# Patient Record
Sex: Male | Born: 1970 | Race: White | Hispanic: No | Marital: Married | Smoking: Current every day smoker
Health system: Southern US, Community
[De-identification: ages and names within clinical notes are randomized; demographics above are authoritative.]

---

## 2014-11-23 ENCOUNTER — Encounter
Admission: EM | Disposition: A | Discharge status: Home / Self Care | Payer: Self-pay | Source: Home / Self Care | Attending: Emergency Medicine

## 2014-11-23 ENCOUNTER — Emergency Department: Payer: Self-pay | Admitting: Anesthesiology

## 2014-11-23 ENCOUNTER — Encounter: Payer: Self-pay | Admitting: *Deleted

## 2014-11-23 ENCOUNTER — Observation Stay
Admission: EM | Admit: 2014-11-23 | Discharge: 2014-11-24 | Disposition: A | Discharge status: Home / Self Care | Payer: Self-pay | Attending: General Surgery | Admitting: General Surgery

## 2014-11-23 ENCOUNTER — Emergency Department: Payer: Self-pay

## 2014-11-23 DIAGNOSIS — M2629 Other anomalies of dental arch relationship: Secondary | ICD-10-CM | POA: Insufficient documentation

## 2014-11-23 DIAGNOSIS — K3580 Unspecified acute appendicitis: Secondary | ICD-10-CM | POA: Insufficient documentation

## 2014-11-23 DIAGNOSIS — K353 Acute appendicitis with localized peritonitis: Principal | ICD-10-CM | POA: Insufficient documentation

## 2014-11-23 DIAGNOSIS — K029 Dental caries, unspecified: Secondary | ICD-10-CM | POA: Insufficient documentation

## 2014-11-23 DIAGNOSIS — F17210 Nicotine dependence, cigarettes, uncomplicated: Secondary | ICD-10-CM | POA: Insufficient documentation

## 2014-11-23 DIAGNOSIS — K37 Unspecified appendicitis: Secondary | ICD-10-CM | POA: Diagnosis present

## 2014-11-23 HISTORY — PX: LAPAROSCOPIC APPENDECTOMY: SHX408

## 2014-11-23 LAB — CBC
HEMATOCRIT: 50 % (ref 40.0–52.0)
Hemoglobin: 17 g/dL (ref 13.0–18.0)
MCH: 30 pg (ref 26.0–34.0)
MCHC: 34 g/dL (ref 32.0–36.0)
MCV: 88.3 fL (ref 80.0–100.0)
PLATELETS: 161 10*3/uL (ref 150–440)
RBC: 5.66 MIL/uL (ref 4.40–5.90)
RDW: 13.3 % (ref 11.5–14.5)
WBC: 17.5 10*3/uL — AB (ref 3.8–10.6)

## 2014-11-23 LAB — URINALYSIS COMPLETE WITH MICROSCOPIC (ARMC ONLY)
Bacteria, UA: NONE SEEN
Bilirubin Urine: NEGATIVE
Glucose, UA: NEGATIVE mg/dL
Ketones, ur: NEGATIVE mg/dL
Leukocytes, UA: NEGATIVE
Nitrite: NEGATIVE
Protein, ur: 30 mg/dL — AB
Specific Gravity, Urine: 1.026 (ref 1.005–1.030)
pH: 5 (ref 5.0–8.0)

## 2014-11-23 LAB — COMPREHENSIVE METABOLIC PANEL WITH GFR
ALT: 22 U/L (ref 17–63)
AST: 26 U/L (ref 15–41)
Albumin: 4.2 g/dL (ref 3.5–5.0)
Alkaline Phosphatase: 101 U/L (ref 38–126)
Anion gap: 10 (ref 5–15)
BUN: 12 mg/dL (ref 6–20)
CO2: 25 mmol/L (ref 22–32)
Calcium: 9.3 mg/dL (ref 8.9–10.3)
Chloride: 103 mmol/L (ref 101–111)
Creatinine, Ser: 1.06 mg/dL (ref 0.61–1.24)
GFR calc Af Amer: >60 mL/min
GFR calc non Af Amer: >60 mL/min
Glucose, Bld: 141 mg/dL — ABNORMAL HIGH (ref 65–99)
Potassium: 3.6 mmol/L (ref 3.5–5.1)
Sodium: 138 mmol/L (ref 135–145)
Total Bilirubin: 0.8 mg/dL (ref 0.3–1.2)
Total Protein: 7.9 g/dL (ref 6.5–8.1)

## 2014-11-23 LAB — LIPASE, BLOOD: Lipase: 17 U/L — ABNORMAL LOW (ref 22–51)

## 2014-11-23 SURGERY — APPENDECTOMY, LAPAROSCOPIC
Anesthesia: General | Wound class: Contaminated

## 2014-11-23 MED ORDER — IOHEXOL 300 MG/ML  SOLN
100.0000 mL | Freq: Once | INTRAMUSCULAR | Status: AC | PRN
  Administered 2014-11-23: 100 mL via INTRAVENOUS

## 2014-11-23 MED ORDER — ONDANSETRON HCL 4 MG/2ML IJ SOLN
4.0000 mg | Freq: Once | INTRAMUSCULAR | Status: AC
  Administered 2014-11-23: 4 mg via INTRAVENOUS
  Filled 2014-11-23: qty 2

## 2014-11-23 MED ORDER — PROPOFOL 10 MG/ML IV BOLUS
INTRAVENOUS | Status: DC | PRN
  Administered 2014-11-23: 200 mg via INTRAVENOUS
  Administered 2014-11-23: 100 mg via INTRAVENOUS

## 2014-11-23 MED ORDER — ROCURONIUM BROMIDE 100 MG/10ML IV SOLN
INTRAVENOUS | Status: DC | PRN
  Administered 2014-11-23: 20 mg via INTRAVENOUS
  Administered 2014-11-23: 10 mg via INTRAVENOUS

## 2014-11-23 MED ORDER — DEXAMETHASONE SODIUM PHOSPHATE 4 MG/ML IJ SOLN
INTRAMUSCULAR | Status: DC | PRN
  Administered 2014-11-23: 20 mg via INTRAVENOUS

## 2014-11-23 MED ORDER — LIDOCAINE HCL (CARDIAC) 20 MG/ML IV SOLN
INTRAVENOUS | Status: DC | PRN
  Administered 2014-11-23: 30 mg via INTRAVENOUS

## 2014-11-23 MED ORDER — FENTANYL CITRATE (PF) 100 MCG/2ML IJ SOLN
INTRAMUSCULAR | Status: DC | PRN
  Administered 2014-11-23 (×2): 50 ug via INTRAVENOUS

## 2014-11-23 MED ORDER — MORPHINE SULFATE 4 MG/ML IJ SOLN
INTRAMUSCULAR | Status: AC
  Filled 2014-11-23: qty 1

## 2014-11-23 MED ORDER — MORPHINE SULFATE 4 MG/ML IJ SOLN
4.0000 mg | Freq: Once | INTRAMUSCULAR | Status: AC
  Administered 2014-11-23: 4 mg via INTRAVENOUS
  Filled 2014-11-23: qty 1

## 2014-11-23 MED ORDER — CEFTRIAXONE SODIUM IN DEXTROSE 20 MG/ML IV SOLN: 1.0000 g | Freq: Once | INTRAVENOUS | Status: AC

## 2014-11-23 MED ORDER — MIDAZOLAM HCL 2 MG/2ML IJ SOLN
INTRAMUSCULAR | Status: DC | PRN
  Administered 2014-11-23: 2 mg via INTRAVENOUS

## 2014-11-23 MED ORDER — MORPHINE SULFATE 4 MG/ML IJ SOLN
4.0000 mg | Freq: Once | INTRAMUSCULAR | Status: AC
  Administered 2014-11-23: 4 mg via INTRAVENOUS

## 2014-11-23 MED ORDER — SUCCINYLCHOLINE CHLORIDE 20 MG/ML IJ SOLN
INTRAMUSCULAR | Status: DC | PRN
  Administered 2014-11-23: 50 mg via INTRAVENOUS
  Administered 2014-11-23: 100 mg via INTRAVENOUS

## 2014-11-23 MED ORDER — SODIUM CHLORIDE 0.9 % IV SOLN
1.0000 g | Freq: Once | INTRAVENOUS | Status: AC
  Administered 2014-11-23: 1 g via INTRAVENOUS
  Filled 2014-11-23: qty 1

## 2014-11-23 MED ORDER — SODIUM CHLORIDE 0.9 % IV SOLN
Freq: Once | INTRAVENOUS | Status: AC
  Administered 2014-11-23 (×2): via INTRAVENOUS

## 2014-11-23 MED ORDER — PHENYLEPHRINE HCL 10 MG/ML IJ SOLN
INTRAMUSCULAR | Status: DC | PRN
  Administered 2014-11-23 (×2): 100 ug via INTRAVENOUS

## 2014-11-23 MED ORDER — LACTATED RINGERS IV SOLN
INTRAVENOUS | Status: DC | PRN
  Administered 2014-11-23: 23:00:00 via INTRAVENOUS

## 2014-11-23 MED ORDER — DEXTROSE 5 % IV SOLN
INTRAVENOUS | Status: AC
  Administered 2014-11-23: 1 g via INTRAVENOUS
  Filled 2014-11-23: qty 10

## 2014-11-23 SURGICAL SUPPLY — 44 items
APPLIER CLIP ROT 10 11.4 M/L (STAPLE) ×3
BLADE SURG SZ11 CARB STEEL (BLADE) ×3 IMPLANT
CANISTER SUCT 1200ML W/VALVE (MISCELLANEOUS) ×3 IMPLANT
CATH TRAY 16F METER LATEX (MISCELLANEOUS) ×3 IMPLANT
CHLORAPREP W/TINT 26ML (MISCELLANEOUS) IMPLANT
CLIP APPLIE ROT 10 11.4 M/L (STAPLE) ×1 IMPLANT
CLOSURE WOUND 1/2 X4 (GAUZE/BANDAGES/DRESSINGS)
CUTTER LINEAR ENDO 35 ART THIN (STAPLE) ×3 IMPLANT
DRSG TEGADERM 2-3/8X2-3/4 SM (GAUZE/BANDAGES/DRESSINGS) IMPLANT
DRSG TELFA 3X8 NADH (GAUZE/BANDAGES/DRESSINGS) IMPLANT
ENDOLOOP SUT PDS II  0 18 (SUTURE)
ENDOLOOP SUT PDS II 0 18 (SUTURE) IMPLANT
ENDOPOUCH RETRIEVER 10 (MISCELLANEOUS) ×3 IMPLANT
GLOVE BIO SURGEON STRL SZ7.5 (GLOVE) ×6 IMPLANT
GLOVE BIOGEL PI IND STRL 8 (GLOVE) ×2 IMPLANT
GLOVE BIOGEL PI INDICATOR 8 (GLOVE) ×4
GOWN STRL REUS W/ TWL LRG LVL3 (GOWN DISPOSABLE) ×2 IMPLANT
GOWN STRL REUS W/TWL LRG LVL3 (GOWN DISPOSABLE) ×4
IRRIGATION STRYKERFLOW (MISCELLANEOUS) ×1 IMPLANT
IRRIGATOR STRYKERFLOW (MISCELLANEOUS) ×3
IV NS 1000ML (IV SOLUTION) ×2
IV NS 1000ML BAXH (IV SOLUTION) ×1 IMPLANT
KIT RM TURNOVER STRD PROC AR (KITS) ×3 IMPLANT
LABEL OR SOLS (LABEL) IMPLANT
LIQUID BAND (GAUZE/BANDAGES/DRESSINGS) ×3 IMPLANT
NDL SAFETY 25GX1.5 (NEEDLE) ×3 IMPLANT
NS IRRIG 500ML POUR BTL (IV SOLUTION) ×3 IMPLANT
PACK LAP CHOLECYSTECTOMY (MISCELLANEOUS) ×3 IMPLANT
PAD GROUND ADULT SPLIT (MISCELLANEOUS) ×3 IMPLANT
RELOAD CUTTER ETS 35MM STAND (ENDOMECHANICALS) ×3 IMPLANT
SCISSORS METZENBAUM CVD 33 (INSTRUMENTS) ×3 IMPLANT
SEAL FOR SCOPE WARMER C3101 (MISCELLANEOUS) IMPLANT
SLEEVE ENDOPATH XCEL 5M (ENDOMECHANICALS) ×3 IMPLANT
STRIP CLOSURE SKIN 1/2X4 (GAUZE/BANDAGES/DRESSINGS) IMPLANT
SUT MNCRL 4-0 (SUTURE) ×2
SUT MNCRL 4-0 27XMFL (SUTURE) ×1
SUT MON AB 5-0 P3 18 (SUTURE) ×3 IMPLANT
SUT VICRYL 0 AB UR-6 (SUTURE) IMPLANT
SUTURE MNCRL 4-0 27XMF (SUTURE) ×1 IMPLANT
SWABSTK COMLB BENZOIN TINCTURE (MISCELLANEOUS) IMPLANT
TROCAR XCEL BLUNT TIP 100MML (ENDOMECHANICALS) IMPLANT
TROCAR XCEL NON-BLD 5MMX100MML (ENDOMECHANICALS) ×3 IMPLANT
TUBING INSUFFLATOR HI FLOW (MISCELLANEOUS) ×3 IMPLANT
WATER STERILE IRR 1000ML POUR (IV SOLUTION) IMPLANT

## 2014-11-23 NOTE — Anesthesia Preprocedure Evaluation (Addendum)
Anesthesia Evaluation  Patient identified by MRN, date of birth, ID band Patient awake    Reviewed: Allergy & Precautions, NPO status , Patient's Chart, lab work & pertinent test results  Airway Mallampati: III  TM Distance: >3 FB Neck ROM: Full   Comment: Relatively high arched palate Dental  (+) Chipped, Poor Dentition, Loose,  Overbite with discolored front teeth.  Poor dentition with advanced decay and some slight mobility of upper right side:   Pulmonary Current Smoker,    Pulmonary exam normal       Cardiovascular negative cardio ROS Normal cardiovascular exam    Neuro/Psych negative psych ROS   GI/Hepatic Neg liver ROS, Acute appendecitis   Endo/Other  negative endocrine ROS  Renal/GU negative Renal ROS  negative genitourinary   Musculoskeletal negative musculoskeletal ROS (+)   Abdominal Normal abdominal exam  (+)   Peds negative pediatric ROS (+)  Hematology negative hematology ROS (+)   Anesthesia Other Findings   Reproductive/Obstetrics                          Anesthesia Physical Anesthesia Plan  ASA: II and emergent  Anesthesia Plan: General   Post-op Pain Management:    Induction: Intravenous, Rapid sequence and Cricoid pressure planned  Airway Management Planned: Oral ETT  Additional Equipment:   Intra-op Plan:   Post-operative Plan: Extubation in OR  Informed Consent: I have reviewed the patients History and Physical, chart, labs and discussed the procedure including the risks, benefits and alternatives for the proposed anesthesia with the patient or authorized representative who has indicated his/her understanding and acceptance.   Dental advisory given  Plan Discussed with: CRNA and Surgeon  Anesthesia Plan Comments:         Anesthesia Quick Evaluation

## 2014-11-23 NOTE — ED Notes (Signed)
Report given to OR nurse; pt updated on wait time

## 2014-11-23 NOTE — Anesthesia Procedure Notes (Signed)
Procedure Name: Intubation Date/Time: 11/23/2014 11:09 PM Performed by: Waldo Laine Pre-anesthesia Checklist: Patient identified, Emergency Drugs available, Suction available, Patient being monitored and Timeout performed Patient Re-evaluated:Patient Re-evaluated prior to inductionOxygen Delivery Method: Circle system utilized Preoxygenation: Pre-oxygenation with 100% oxygen Intubation Type: IV induction and Cricoid Pressure applied Ventilation: Mask ventilation with difficulty and Oral airway inserted - appropriate to patient size Laryngoscope Size: Glidescope, Miller, 2, Mac and 3 Grade View: Grade IV Tube type: Oral Number of attempts: 5 or more Airway Equipment and Method: Video-laryngoscopy and Stylet Placement Confirmation: positive ETCO2 and breath sounds checked- equal and bilateral Secured at: 22 cm Tube secured with: Tape Dental Injury: Teeth and Oropharynx as per pre-operative assessment and Bloody posterior oropharynx  Difficulty Due To: Difficult Airway- due to anterior larynx and Difficulty was anticipated Future Recommendations: Recommend- induction with short-acting agent, and alternative techniques readily available Comments: Small laceration noted on left side of palate, will consult with ENT.

## 2014-11-23 NOTE — ED Provider Notes (Signed)
[facility] Emergency Department Provider Note     Time seen: ----------------------------------------- 2:10 PM on 11/23/2014 -----------------------------------------    I have reviewed the triage vital signs and the nursing notes.   HISTORY  Chief Complaint Emesis and Abdominal Pain    HPI Jeffrey Ross is a 44 y.o. male who presents ER with nausea vomiting started last night followed by abdominal pain.Pain started in the central abdomen now seems to be worse in the lower abdomen bilaterally. Patient exhibits the pain may be worse in the right lower quadrant. Has had nausea and vomiting, no diarrhea. Denies fevers chills or other complaints. Pain is worse whenever he moves around, currently severe.   History reviewed. No pertinent past medical history.  There are no active problems to display for this patient.   History reviewed. No pertinent past surgical history.  Allergies Review of patient's allergies indicates no known allergies.  Social History History  Substance Use Topics  . Smoking status: Current Every Day Smoker -- 1.00 packs/day    Types: Cigarettes  . Smokeless tobacco: Never Used  . Alcohol Use: No    Review of Systems Constitutional: Negative for fever. Eyes: Negative for visual changes. ENT: Negative for sore throat. Cardiovascular: Negative for chest pain. Respiratory: Negative for shortness of breath. Gastrointestinal: Positive for abdominal pain and vomiting Genitourinary: Negative for dysuria. Musculoskeletal: Negative for back pain. Skin: Negative for rash. Neurological: Positive for weakness  10-point ROS otherwise negative.  ____________________________________________   PHYSICAL EXAM:  VITAL SIGNS: ED Triage Vitals  Enc Vitals Group     BP 11/23/14 1252 151/90 mmHg     Pulse Rate 11/23/14 1252 114     Resp --      Temp 11/23/14 1252 98.6 F (37 C)     Temp Source 11/23/14 1252 Oral     SpO2  11/23/14 1252 96 %     Weight 11/23/14 1252 229 lb (103.874 kg)     Height 11/23/14 1252 6' (1.829 m)     Head Cir --      Peak Flow --      Pain Score 11/23/14 1252 2     Pain Loc --      Pain Edu? --      Excl. in GC? --     Constitutional: Alert and oriented. Mild distress Eyes: Conjunctivae are normal. PERRL. Normal extraocular movements. ENT   Head: Normocephalic and atraumatic.   Nose: No congestion/rhinnorhea.   Mouth/Throat: Dry mucous membranes   Neck: No stridor. Cardiovascular: Normal rate, regular rhythm. Normal and symmetric distal pulses are present in all extremities. No murmurs, rubs, or gallops. Respiratory: Normal respiratory effort without tachypnea nor retractions. Breath sounds are clear and equal bilaterally. No wheezes/rales/rhonchi. Gastrointestinal: Specific right lower quadrant abdominal pain. Pain in McBurney's point. There is no rebound or guarding. Hypoactive bowel sounds. Negative Rovsing sign Musculoskeletal: Nontender with normal range of motion in all extremities. No joint effusions.  No lower extremity tenderness nor edema. Neurologic:  Normal speech and language. No gross focal neurologic deficits are appreciated.  Skin:  Skin is warm, dry and intact. No rash noted. Psychiatric: Mood and affect are normal. Speech and behavior are normal.  ____________________________________________  ED COURSE:  Pertinent labs & imaging results that were available during my care of the patient were reviewed by me and considered in my medical decision making (see chart for details). Clinically patient has appendicitis, will CT give IV fluids, and morphine, Zofran and reevaluate ____________________________________________  LABS (pertinent positives/negatives)  Labs Reviewed  LIPASE, BLOOD - Abnormal; Notable for the following:    Lipase 17 (*)    All other components within normal limits  COMPREHENSIVE METABOLIC PANEL - Abnormal; Notable for the  following:    Glucose, Bld 141 (*)    All other components within normal limits  CBC - Abnormal; Notable for the following:    WBC 17.5 (*)    All other components within normal limits  URINALYSIS COMPLETEWITH MICROSCOPIC ([facility] ONLY) - Abnormal; Notable for the following:    Color, Urine YELLOW (*)    APPearance CLEAR (*)    Hgb urine dipstick 2+ (*)    Protein, ur 30 (*)    Squamous Epithelial / LPF 0-5 (*)    All other components within normal limits  SURGICAL PATHOLOGY    RADIOLOGY Images were viewed by me  CT abdomen and pelvis confirms appendicitis  ____________________________________________  FINAL ASSESSMENT AND PLAN  Acute appendicitis  Plan: Patient with labs and imaging as dictated above. Patient is been given fluids, morphine and Zofran. Will discuss with general surgery. He is currently stable, no acute distress.   Emily Filbert, MD   Emily Filbert, MD 11/25/14 [phone]

## 2014-11-23 NOTE — H&P (Signed)
CC: RLQ pain, nausea, chills x 1 day  HPI:  Jeffrey Ross is a pleasant 44 yo M who presents with 1 day of abdominal pain, nausea and chills.  Began acutely yesterday and worsened by 7 pm.  + nausea, only emesis was self-induced.  Initially periumbilical and now RLQ.  Has never had this pain before. WBC 17.5, CT shows dilated, thickened appendix with periappendiceal stranding.  Active Ambulatory Problems    Diagnosis Date Noted  . No Active Ambulatory Problems   Resolved Ambulatory Problems    Diagnosis Date Noted  . No Resolved Ambulatory Problems   No Additional Past Medical History     Medication List    ASK your doctor about these medications        BC FAST PAIN RELIEF ARTHRITIS PO  Take 1-2 packets by mouth 2 (two) times daily as needed (for back pain).       No Known Allergies   History   Social History  . Marital Status: Married    Spouse Name: N/A  . Number of Children: N/A  . Years of Education: N/A   Occupational History  . Not on file.   Social History Main Topics  . Smoking status: Current Every Day Smoker -- 1.00 packs/day    Types: Cigarettes  . Smokeless tobacco: Never Used  . Alcohol Use: No  . Drug Use: No  . Sexual Activity: Not on file   Other Topics Concern  . Not on file   Social History Narrative  . No narrative on file   No family history on file.   ROS: Full ROS obtained, pertinent positives and negatives as above.  Blood pressure 148/80, pulse 92, temperature 98.6 F (37 C), temperature source Oral, resp. rate 18, height 6' (1.829 m), weight 103.874 kg (229 lb), SpO2 99 %. GEN: NAD/A&Ox3 FACE: no obvious facial trauma, normal external nose, normal external ears EYES: no scleral icterus, no conjunctivitis HEAD: normocephalic atraumatic CV: RRR, no MRG RESP: moving air well, lungs clear ABD: soft, tender to palp RLQ, nondistended EXT: moving all ext well, strength 5/5 NEURO: cnII-XII grossly intact, sensation intact all 4  ext  Labs: Personally reviewed, significant for WBC 17.5  CT: Personally reviewed, significant for enlarged, dilated appendix with periappendiceal stranding  A/P 44 yo with clinical and radiographic signs of appendicitis.  I have recommended laparoscopic appendectomy.  I have described the procedure to him and its risks and benefits. He would like to proceed.

## 2014-11-23 NOTE — ED Notes (Signed)
Pt here with c/o N/V that started last night then was followed by abdominal pain.  Pt advises he has abdominal pain and nausea constantly since last night.

## 2014-11-23 NOTE — ED Notes (Signed)
Surgeon at bedside.  

## 2014-11-24 ENCOUNTER — Encounter: Payer: Self-pay | Admitting: General Surgery

## 2014-11-24 DIAGNOSIS — K37 Unspecified appendicitis: Secondary | ICD-10-CM | POA: Diagnosis present

## 2014-11-24 DIAGNOSIS — K3580 Unspecified acute appendicitis: Secondary | ICD-10-CM | POA: Insufficient documentation

## 2014-11-24 MED ORDER — HYDROCODONE-ACETAMINOPHEN 5-325 MG PO TABS: 1.0000 | ORAL_TABLET | ORAL | Status: AC | PRN

## 2014-11-24 MED ORDER — LACTATED RINGERS IV SOLN
Freq: Once | INTRAVENOUS | Status: AC
  Administered 2014-11-24: 03:00:00 via INTRAVENOUS

## 2014-11-24 MED ORDER — MORPHINE SULFATE 4 MG/ML IJ SOLN: 4.0000 mg | INTRAMUSCULAR | Status: DC | PRN

## 2014-11-24 MED ORDER — ONDANSETRON HCL 4 MG/2ML IJ SOLN: 4.0000 mg | Freq: Four times a day (QID) | INTRAMUSCULAR | Status: DC | PRN

## 2014-11-24 MED ORDER — HYDROCODONE-ACETAMINOPHEN 5-325 MG PO TABS
1.0000 | ORAL_TABLET | ORAL | Status: DC | PRN
  Administered 2014-11-24 (×2): 1 via ORAL
  Filled 2014-11-24 (×2): qty 1

## 2014-11-24 MED ORDER — ONDANSETRON HCL 4 MG/2ML IJ SOLN
INTRAMUSCULAR | Status: DC | PRN
  Administered 2014-11-24: 4 mg via INTRAVENOUS

## 2014-11-24 MED ORDER — FENTANYL CITRATE (PF) 100 MCG/2ML IJ SOLN: 25.0000 ug | INTRAMUSCULAR | Status: DC | PRN

## 2014-11-24 MED ORDER — BUPIVACAINE HCL 0.5 % IJ SOLN
INTRAMUSCULAR | Status: DC | PRN
  Administered 2014-11-24: 10 mL

## 2014-11-24 MED ORDER — GLYCOPYRROLATE 0.2 MG/ML IJ SOLN
INTRAMUSCULAR | Status: DC | PRN
  Administered 2014-11-24: 0.6 mg via INTRAVENOUS

## 2014-11-24 MED ORDER — LIDOCAINE HCL 1 % IJ SOLN
INTRAMUSCULAR | Status: DC | PRN
  Administered 2014-11-24: 10 mL

## 2014-11-24 MED ORDER — NEOSTIGMINE METHYLSULFATE 10 MG/10ML IV SOLN
INTRAVENOUS | Status: DC | PRN
  Administered 2014-11-24: 3 mg via INTRAVENOUS

## 2014-11-24 MED ORDER — ONDANSETRON HCL 4 MG PO TABS: 4.0000 mg | ORAL_TABLET | Freq: Four times a day (QID) | ORAL | Status: DC | PRN

## 2014-11-24 MED ORDER — ONDANSETRON HCL 4 MG/2ML IJ SOLN: 4.0000 mg | Freq: Once | INTRAMUSCULAR | Status: DC | PRN

## 2014-11-24 MED ORDER — SODIUM CHLORIDE 0.9 % IV SOLN
INTRAVENOUS | Status: DC | PRN
  Administered 2014-11-24: 01:00:00 via INTRAVENOUS

## 2014-11-24 MED ORDER — SODIUM CHLORIDE 0.9 % IR SOLN
Status: DC | PRN
  Administered 2014-11-24: 250 mL

## 2014-11-24 NOTE — Discharge Summary (Signed)
Discharge diagnosis: Acute appendicitis Procedure performed: Laparoscopic appendectomy    Medication List    TAKE these medications        BC FAST PAIN RELIEF ARTHRITIS PO  Take 1-2 packets by mouth 2 (two) times daily as needed (for back pain).     HYDROcodone-acetaminophen 5-325 MG per tablet  Commonly known as:  NORCO/VICODIN  Take 1-2 tablets by mouth every 4 (four) hours as needed for moderate pain.       Hospital Course: Jeffrey Ross underwent unremarkable laparoscopic appendectomy.  His diet was advanced postop from liquids to regular and his pain medications were converted from IV to PO.  At time of discharge he was tolerating a regular diet with good oral pain control.

## 2014-11-24 NOTE — Discharge Instructions (Signed)
Do not drive on pain medications °Do not lift greater than 15 lbs for a period of 6 weeks °Call or return to ER if you develop fever greater than 101.5, nausea/vomiting, increased pain, redness/drainage from incisions °Take bandages off in 48 hours.  Okay to shower with bandages on or after they come off, no tub bathsLaparoscopic Appendectomy °Appendectomy is surgery to remove the appendix. Laparoscopic surgery uses several small cuts (incisions) instead of one large incision. Laparoscopic surgery offers a shorter recovery time and less discomfort. °LET YOUR CAREGIVER KNOW ABOUT: °· Allergies to food or medicine. °· Medicines taken, including vitamins, dietary supplements, herbs, eyedrops, over-the-counter medicines, and creams. °· Use of steroids (by mouth or creams). °· Previous problems with anesthetics or numbing medicines. °· History of bleeding problems or blood clots. °· Previous surgery. °· Other health problems, including diabetes, heart problems, lung problems, and kidney problems. °· Possibility of pregnancy, if this applies. °RISKS AND COMPLICATIONS °· Infection. A germ starts growing in the wound. This can usually be treated with antibiotics. In some cases, the wound will need to be opened and cleaned. °· Bleeding. °· Damage to other organs. °· Sores (abscesses). °· Chronic pain at the incision sites. This is defined as pain that lasts for more than 3 months. °· Blood clots in the legs that may rarely travel to the lungs. °· Infection in the lungs (pneumonia). °BEFORE THE PROCEDURE °Appendectomy is usually performed immediately after an inflamed appendix (appendicitis) is diagnosed. No preparation is necessary ahead of this procedure. °PROCEDURE  °You will be given medicine that makes you sleep (general anesthetic). After you are asleep, a flexible tube (catheter) may be inserted into your bladder to drain your urine during surgery. The tube is removed before you wake up after surgery. When you are  asleep, carbon dioxide gas will be used to inflate your abdomen. This will allow your surgeon to see inside your abdomen and perform your surgery. Three small incisions will be made in your abdomen. Your surgeon will insert a thin, lighted tube (laparoscope) through one of the incisions. Your surgeon will look through the laparoscope while performing the surgery. Other tools will be inserted through the other incisions. Laparoscopic procedures may not be appropriate when: °· There is major scarring from a previous surgery. °· The patient has bleeding disorders. °· A pregnancy is near term. °· There are other conditions which make the laparoscopic procedure impossible, such as an advanced infection or a ruptured appendix. °If your surgeon feels it is not safe to continue with the laparoscopic procedure, he or she will perform an open surgery instead. This gives the surgeon a larger view and more space to work. Open surgery requires a longer recovery time. After your appendix is removed, your incisions will be closed with stitches (sutures) or skin adhesive. °AFTER THE PROCEDURE °You will be taken to a recovery room. When the anesthesia has worn off, you will be returned to your hospital room. You will be given pain medicines to keep you comfortable. Ask your caregiver how long your hospital stay will be. °Document Released: 11/28/2003 Document Revised: 07/08/2011 Document Reviewed: 10/23/2010 °[employer]® Patient Information ©2015 [employer]. This information is not intended to replace advice given to you by your health care provider. Make sure you discuss any questions you have with your health care provider. ° °

## 2014-11-24 NOTE — Transfer of Care (Signed)
Immediate Anesthesia Transfer of Care Note  Patient: Jeffrey Ross  Procedure(s) Performed: Procedure(s): APPENDECTOMY LAPAROSCOPIC (N/A)  Patient Location: PACU  Anesthesia Type:General  Level of Consciousness: awake, oriented and patient cooperative  Airway & Oxygen Therapy: Patient Spontanous Breathing and Patient connected to face mask oxygen  Post-op Assessment: Report given to RN and Post -op Vital signs reviewed and stable  Post vital signs: Reviewed and stable  Last Vitals:  Filed Vitals:   11/23/14 2152  BP: 147/84  Pulse: 77  Temp: 37.1 C  Resp:     Complications: soft tissue trauma

## 2014-11-24 NOTE — Op Note (Signed)
laparascopic appendectomy   [facility] Date of operation:  11/24/2014  Indications: The patient presented with a history of  abdominal pain. Workup has revealed findings consistent with acute appendicitis.  Pre-operative Diagnosis: Acute appendicitis without mention of peritonitis  Post-operative Diagnosis: Acute appendicitis without mention of peritonitis  Surgeon: Leonette Most T. Tonita Cong, MD, FACS  Anesthesia: General with endotracheal tube  Procedure Details  The patient was seen again in the preop area. The options of surgery versus observation were reviewed with the patient and/or family. The risks of bleeding, infection, recurrence of symptoms, negative laparoscopy, potential for an open procedure, bowel injury, abscess or infection, were all reviewed as well. The patient was taken to Operating Room, identified as Jeffrey Ross and the procedure verified as laparoscopic appendectomy. A Time Out was held and the above information confirmed.  The patient was placed in the supine position and general anesthesia was induced.  Antibiotic prophylaxis was administered and VT E prophylaxis was in place. A Foley catheter was placed by the nursing staff.   The abdomen was prepped and draped in a sterile fashion. An infraumbilical incision was made. A Veress needle was placed and pneumoperitoneum was obtained. A 5 mm trocar port was placed without difficulty and the abdominal cavity was explored.  Under direct vision a 5 mm suprapubic port was placed and a 12 mm left lateral port was placed all under direct vision.  The appendix was identified and found to be acutely inflamed and in the retrocolic position. The appendix was carefully dissected. The base of the appendix was dissected out and divided with a standard load Endo GIA. The mesoappendix was divided with a vascular load Endo GIA. An area of bleeding was noted on the mesoappendix that was controled with a clip applier.  The appendix was passed  out through the left lateral port site with the aid of an Endo Catch bag. The right lower quadrant and pelvis was then irrigated with copious amounts of normal saline which was aspirated. Inspection  failed to identify any additional bleeding and there were no signs of bowel injury. Therefore the ports were removed all under direct vision.   Again the right lower quadrant was inspected there was no sign of bleeding or bowel injury therefore pneumoperitoneum was released, all ports were removed and the skin incisions were approximated with subcuticular 4-0 Monocryl. Dermabond was applied as a sterile dressing.  The patient tolerated the procedure well, there were no complications. The sponge lap and needle count were correct at the end of the procedure.  The patient was taken to the recovery room in stable condition to be admitted for continued care.  Findings: Acute Appendicitis   Estimated Blood Loss: 10                  Specimens: appendix         Complications:  none                  Ricarda Frame MD, FACS

## 2014-11-24 NOTE — Progress Notes (Signed)
Pt. Arrived from PACU approx. 01:45, A/O, drifting into sleep at times. Pt. Requested to go to the BR to urinate but was allowed to stand at the side of the bed and use the urinal Pt. Able to void with no issues. Pt. Complained of a dull headache but no surgical pain, Norco 1 tab admin. Pt resting. Pt. On 1L Nasal cannula as respirations fall during sleep, SATS in the mid to high 90's.

## 2014-11-24 NOTE — Progress Notes (Signed)
Regular diet order per Dr. Juliann Pulse

## 2014-11-24 NOTE — Anesthesia Postprocedure Evaluation (Signed)
  Anesthesia Post-op Note  Patient: Jeffrey Ross  Procedure(s) Performed: Procedure(s): APPENDECTOMY LAPAROSCOPIC (N/A)  Anesthesia type:General  Patient location: PACU  Post pain: Pain level controlled  Post assessment: Post-op Vital signs reviewed, Patient's Cardiovascular Status Stable, Respiratory Function Stable, Patent Airway and No signs of Nausea or vomiting  Post vital signs: Reviewed and stable  Last Vitals:  Filed Vitals:   11/24/14 0050  BP:   Pulse:   Temp: 37.2 C  Resp:     Level of consciousness: awake, alert  and patient cooperative  Complications:  The patient surprisingly was a difficult intubation requiring both a bougie and a glidescope.  Upon intubation attempts , a small 4-5 mm laceration was noted on the left side of the soft palate apparently caused by the bougie or glidescope.  The patient had an overbite and surprisingly more anterior axis to the glottic opening not visualized by the traditional laryngoscope.  Dr. Elenore Rota of ENT was consulted and looked at a picture of the defect and thought that it would heal well on its own without a suture if it came together well as it indeed did upon gentle exam with little traction.  Dr. Elenore Rota will evaluate in the am.

## 2014-11-24 NOTE — Brief Op Note (Signed)
11/23/2014 - 11/24/2014  12:32 AM  PATIENT:  Jeffrey Ross  44 y.o. male  PRE-OPERATIVE DIAGNOSIS:  Appendicitis  POST-OPERATIVE DIAGNOSIS:  Appendicitis  PROCEDURE:  Procedure(s): APPENDECTOMY LAPAROSCOPIC (N/A)  SURGEON:  Surgeon(s) and Role:    * Ricarda Frame, MD - Primary  PHYSICIAN ASSISTANT:   ASSISTANTS: none   ANESTHESIA:   general  EBL:     BLOOD ADMINISTERED:none  DRAINS: none   LOCAL MEDICATIONS USED:  0.5% MARCAINE   ,1%  LIDOCAINE  and Amount: 20 ml  SPECIMEN:  Source of Specimen:  appendix  DISPOSITION OF SPECIMEN:  PATHOLOGY  COUNTS:  YES  TOURNIQUET:  * No tourniquets in log *  DICTATION: .Dragon Dictation  PLAN OF CARE: Admit for overnight observation  PATIENT DISPOSITION:  PACU - hemodynamically stable.   Delay start of Pharmacological VTE agent (>24hrs) due to surgical blood loss or risk of bleeding: no

## 2014-11-24 NOTE — Progress Notes (Signed)
Pt A and O x 4. VSS. Pt had regular tray for lunch and walked around floor prior to being discharged. Pt tolerating diet well. Minimal complaints of pain with meds given to control. Pt has three lap sites clean and dry with liquid skin. IV removed intact, prescriptions given. Pt voiced understanding of discharge instructions with no further questions. Pt discharged via wheelchair with axillary.

## 2014-11-25 LAB — SURGICAL PATHOLOGY

## 2014-12-01 ENCOUNTER — Ambulatory Visit (INDEPENDENT_AMBULATORY_CARE_PROVIDER_SITE_OTHER): Payer: Self-pay | Admitting: General Surgery

## 2014-12-01 ENCOUNTER — Encounter: Payer: Self-pay | Admitting: General Surgery

## 2014-12-01 VITALS — BP 131/89 | HR 71 | Temp 98.0°F | Ht 72.0 in | Wt 222.0 lb

## 2014-12-01 DIAGNOSIS — Z48815 Encounter for surgical aftercare following surgery on the digestive system: Secondary | ICD-10-CM

## 2014-12-01 NOTE — Progress Notes (Signed)
Outpatient Surgical Follow Up  12/01/2014  Jeffrey Ross is an 44 y.o. male.   Chief Complaint  Patient presents with  . Routine Post Op    follow up    HPI: 44 year old male returning to clinic 1 week status post laparoscopic appendectomy. Patient reports doing well with return of appetite and normal bowel and bladder function. Patient does state that he has had some abdominal pain in the last 24 hours secondary to his daughter jumping on his abdomen. He states the pain is nothing like before his appendectomy and that it is not related to his surgical sites. Patient very happy with the surgical results at this time. Denies any fevers chills nausea vomiting chest pain shortness of breath constipation or diarrhea.  History reviewed. No pertinent past medical history.  Past Surgical History  Procedure Laterality Date  . Laparoscopic appendectomy N/A 11/23/2014    Procedure: APPENDECTOMY LAPAROSCOPIC;  Surgeon: Ricarda Frame, MD;  Location: [facility] ORS;  Service: General;  Laterality: N/A;    Family History  Problem Relation Age of Onset  . Depression Mother   . Hypertension Father   . Heart disease Father     Social History:  reports that he has been smoking Cigarettes.  He has been smoking about 1.00 pack per day. He has never used smokeless tobacco. He reports that he does not drink alcohol or use illicit drugs.  Allergies: No Known Allergies  Medications reviewed.    ROS multisystem review of systems was completed and all pertinent positives and negatives were reported in the history of present illness the remainder negative.    BP 131/89 mmHg  Pulse 71  Temp(Src) 98 F (36.7 C) (Oral)  Ht 6' (1.829 m)  Wt 100.699 kg (222 lb)  BMI 30.10 kg/m2  Physical Exam Gen. acute distress Chest clear to auscultation and regular rate and rhythm Abdomen soft nontender nondistended without any masses. Laparoscopic appendectomy sites well approximated without any signs of infection  or drainage.   Pathology: Consistent with acute appendicitis reviewed with the patient. No results found for this or any previous visit (from the past 48 hour(s)). No results found.  Assessment/Plan:  1. Encounter for surgical aftercare following surgery of digestive system 44 year old male 1 week status post lap Appendectomy. Discussed again the recommendations for limitations on lifting and limitation on submerging for an additional week. Patient voiced understanding. His pathology was reviewed with him. He will follow up on a when necessary basis.     Ricarda Frame  12/01/2014,negative

## 2017-03-01 IMAGING — CT CT ABD-PELV W/ CM
1 of 3 series · 14 of 32 positions shown, 19 images · IV contrast (omnipaque)
Comparison: None.

CLINICAL DATA: Nausea and vomiting.  Right lower quadrant pain

EXAM:
CT ABDOMEN AND PELVIS WITH CONTRAST
TECHNIQUE: Multidetector CT imaging of the abdomen and pelvis was performed
using the standard protocol following bolus administration of
intravenous contrast.
CONTRAST:  100mL OMNIPAQUE IOHEXOL 300 MG/ML  SOLN

[Series 2: routine abd pel with · axial · 0.76mm/px · z∈[-626,-186]mm · 14 of 98 slices shown, 19 images]
[im 5/98  soft-tissue]
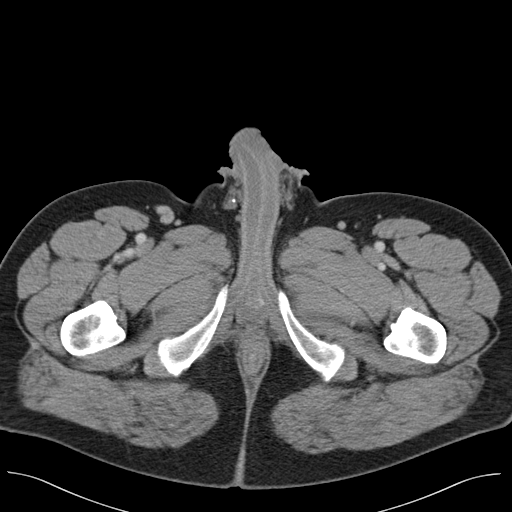
[im 5/98  bone]
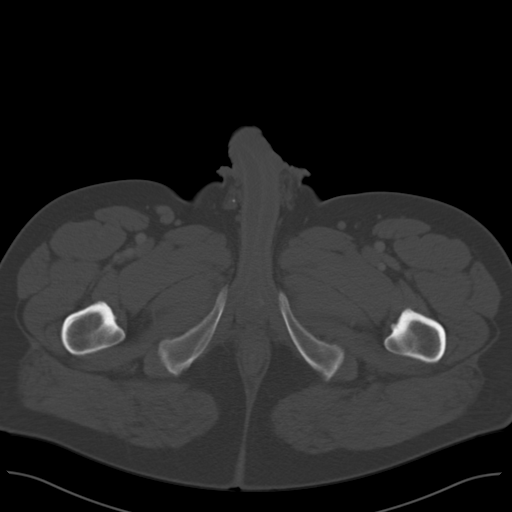
[im 15/98  soft-tissue]
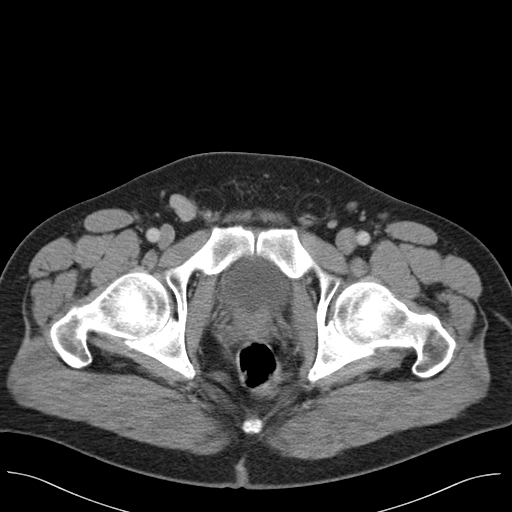
[im 20/98  soft-tissue]
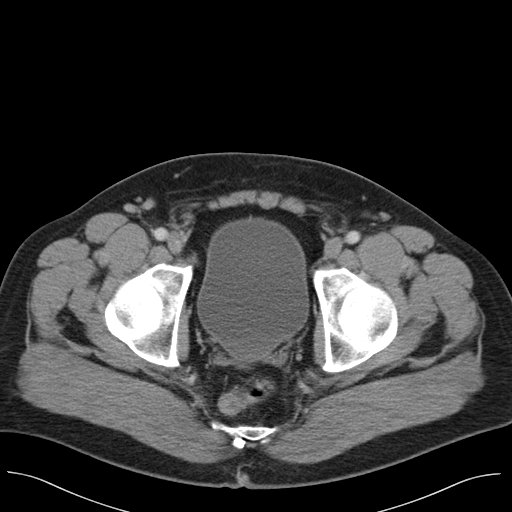
[im 30/98  soft-tissue]
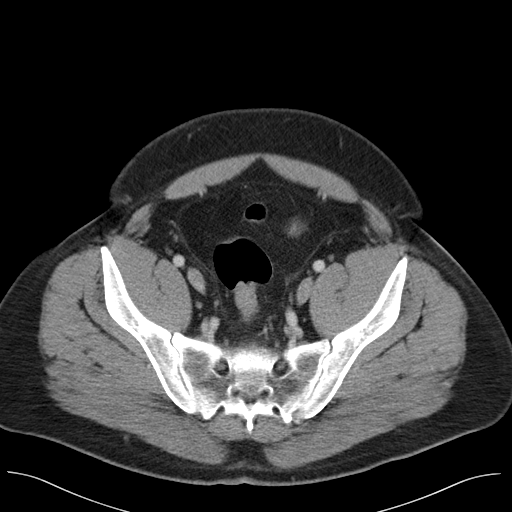
[im 34/98  soft-tissue]
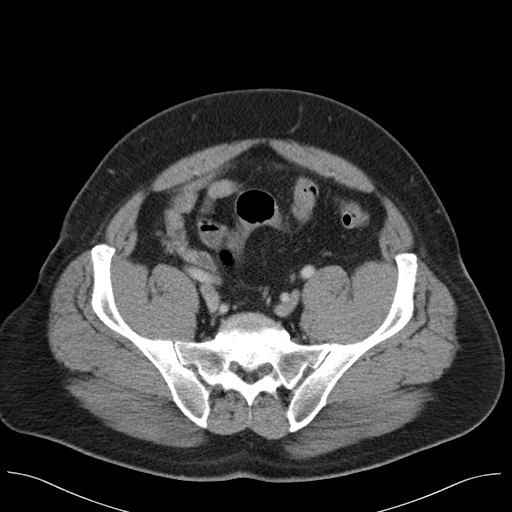
[im 44/98  soft-tissue]
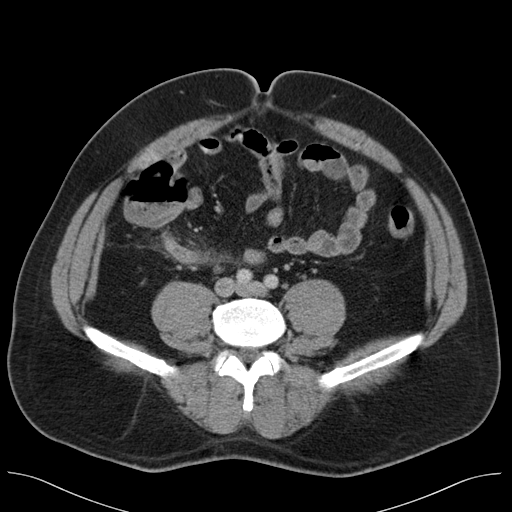
[im 49/98  soft-tissue]
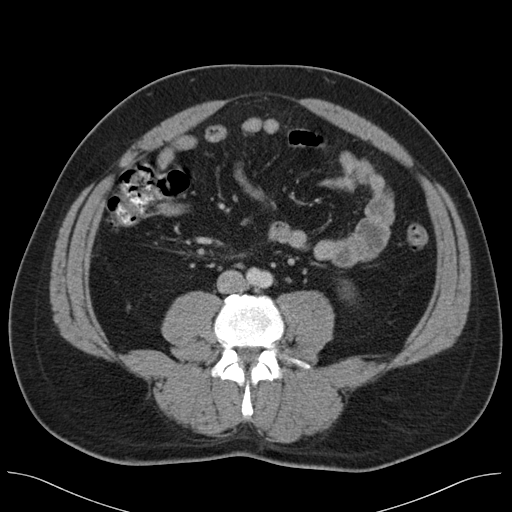
[im 54/98  soft-tissue]
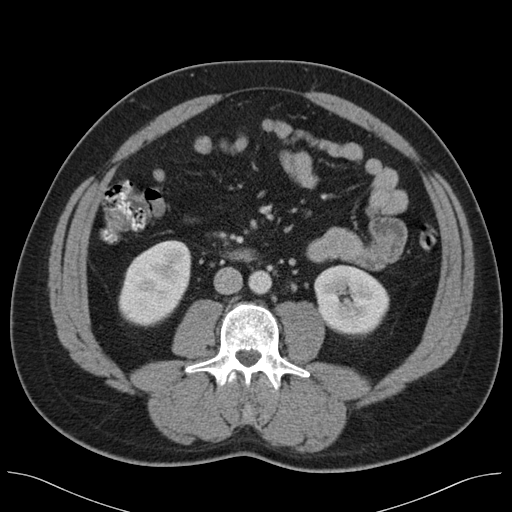
[im 64/98  soft-tissue]
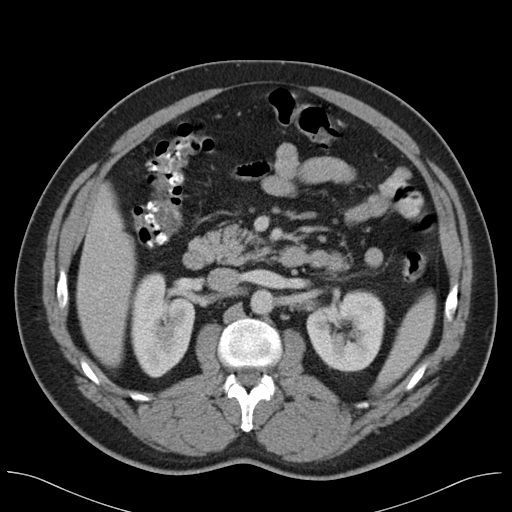
[im 64/98  bone]
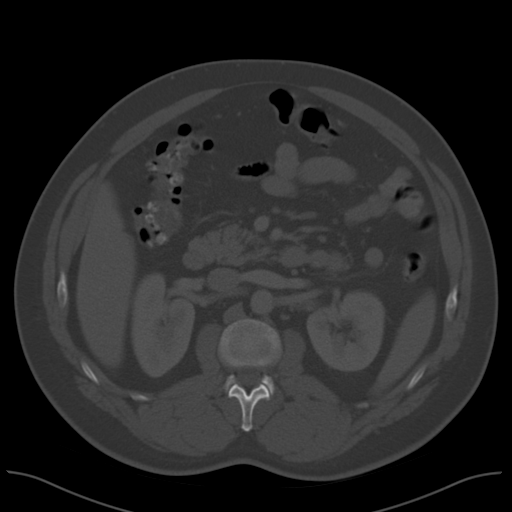
[im 68/98  soft-tissue]
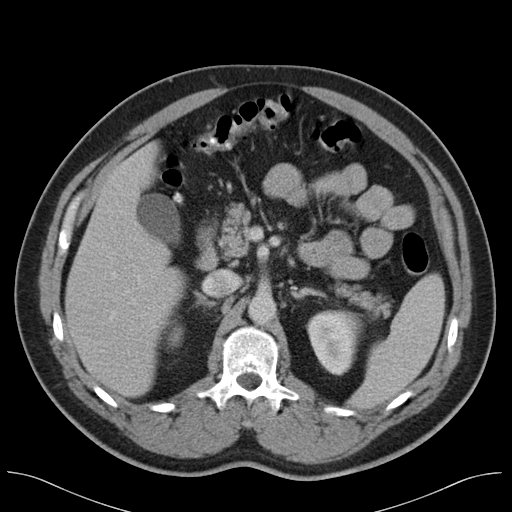
[im 78/98  soft-tissue]
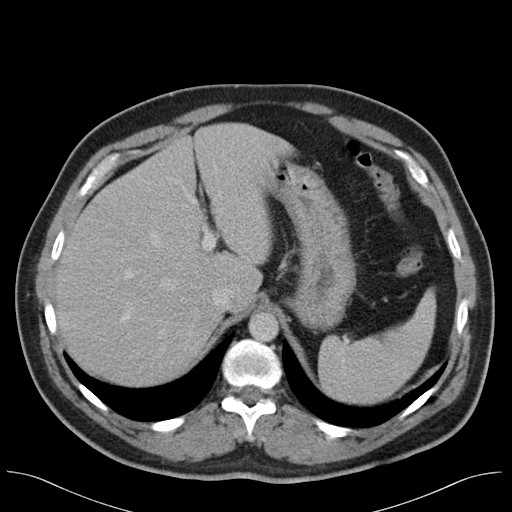
[im 78/98  lung]
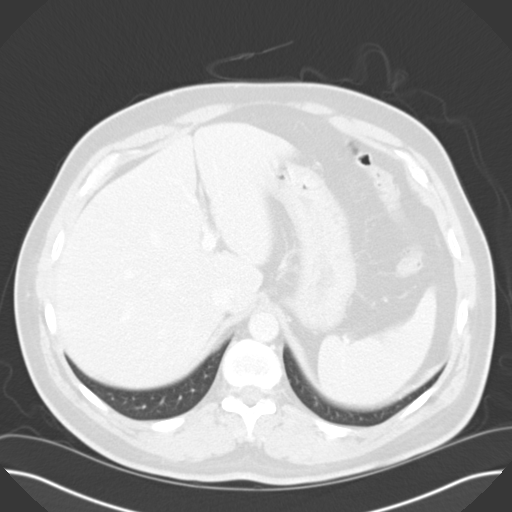
[im 83/98  soft-tissue]
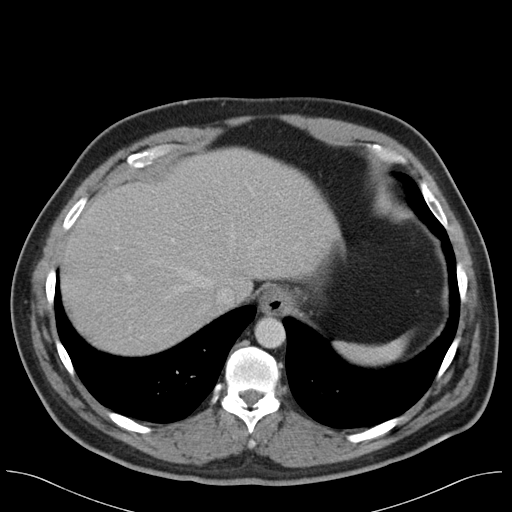
[im 83/98  lung]
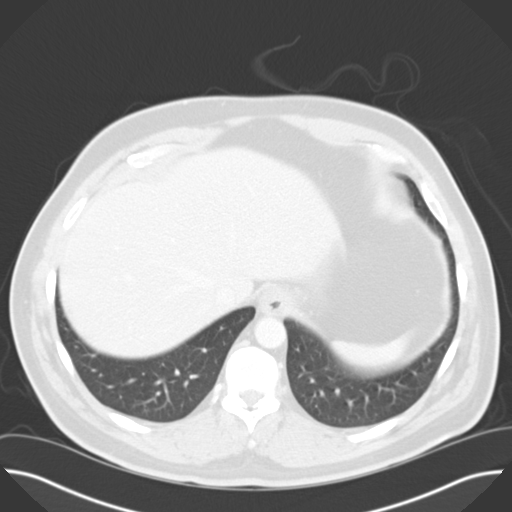
[im 88/98  lung]
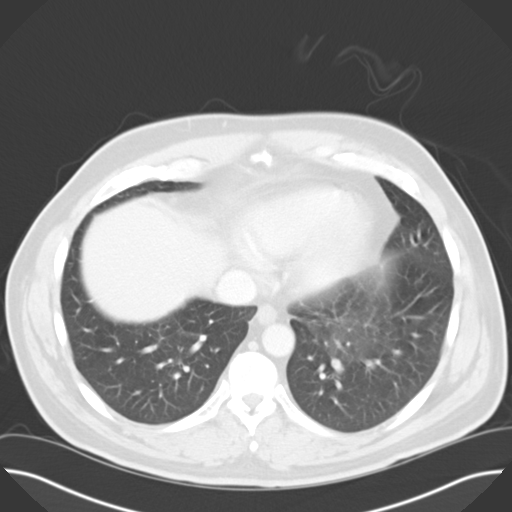
[im 93/98  soft-tissue]
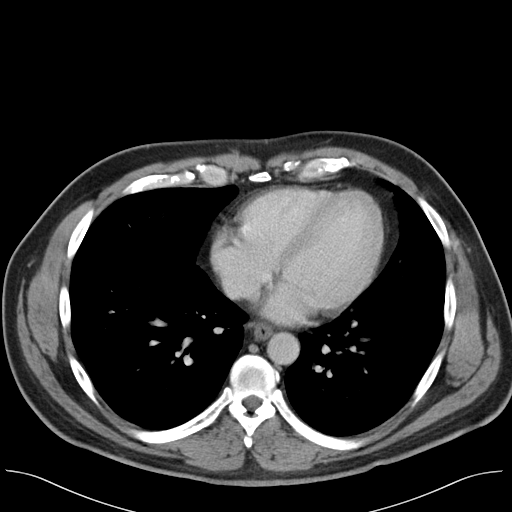
[im 93/98  lung]
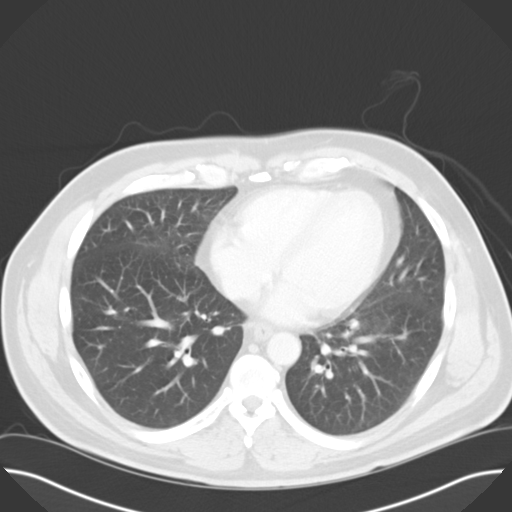

[14 of 32 positions shown; findings below may reference images not displayed]

FINDINGS: Lower chest: The lung bases are clear. No pleural or pericardial
effusion identified.

Hepatobiliary: Hepatic steatosis noted. No suspicious liver
abnormality. Gallbladder is normal. No biliary dilatation.

Pancreas: Negative

Spleen: Negative

Adrenals/Urinary Tract: The adrenal glands are normal. Small low
attenuation structure within the inferior pole of right kidney
measures 7 mm and is too small to characterize. The left kidney is
normal. Urinary bladder appears normal.

Stomach/Bowel: The stomach is within normal limits. The small bowel
loops have a normal course and caliber. No obstruction. Normal
appearance of the colon. The appendix is abnormal measuring 10 mm in
diameter. There is diffuse periappendiceal fat stranding. No
definite evidence for perforation. No fluid collection identified.

Vascular/Lymphatic: Normal appearance of the abdominal aorta. No
enlarged upper abdominal lymph nodes. Prominent right inguinal node
measures 13 mm.

Reproductive: The prostate gland and seminal vesicles are
unremarkable.

Other: There is no free fluid or fluid collections within the
abdomen or pelvis.

Musculoskeletal: Degenerative disc disease noted within the lower
lumbar spine. No aggressive lytic or sclerotic bone lesions.
IMPRESSION: 1. Findings consistent with acute appendicitis. No definite evidence
to suggest perforation or abscess formation.
2. Prominent right inguinal lymph node.
